# Patient Record
Sex: Female | Born: 1985 | Race: White | Hispanic: No | Marital: Single | Smoking: Never smoker
Health system: Southern US, Community
[De-identification: ages and names within clinical notes are randomized; demographics above are authoritative.]

## PROBLEM LIST (undated history)

## (undated) DIAGNOSIS — N3010 Interstitial cystitis (chronic) without hematuria: Secondary | ICD-10-CM

---
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
## 2003-01-12 ENCOUNTER — Other Ambulatory Visit: Admission: RE | Admit: 2003-01-12 | Discharge: 2003-01-12 | Payer: Self-pay | Admitting: Obstetrics and Gynecology

## 2004-01-31 ENCOUNTER — Other Ambulatory Visit: Admission: RE | Admit: 2004-01-31 | Discharge: 2004-01-31 | Payer: Self-pay | Admitting: Obstetrics and Gynecology

## 2009-06-05 ENCOUNTER — Ambulatory Visit (HOSPITAL_BASED_OUTPATIENT_CLINIC_OR_DEPARTMENT_OTHER): Admission: RE | Admit: 2009-06-05 | Discharge: 2009-06-05 | Payer: Self-pay | Admitting: Urology

## 2010-09-23 ENCOUNTER — Emergency Department (HOSPITAL_COMMUNITY): Admission: EM | Admit: 2010-09-23 | Discharge: 2010-09-23 | Payer: Self-pay | Admitting: Emergency Medicine

## 2010-09-30 ENCOUNTER — Other Ambulatory Visit (HOSPITAL_COMMUNITY)
Admission: RE | Admit: 2010-09-30 | Discharge: 2010-10-16 | Payer: Self-pay | Source: Home / Self Care | Attending: Psychiatry | Admitting: Psychiatry

## 2010-10-10 ENCOUNTER — Ambulatory Visit (HOSPITAL_COMMUNITY): Payer: Self-pay | Admitting: Psychiatry

## 2010-10-11 ENCOUNTER — Ambulatory Visit: Payer: Self-pay | Admitting: Psychiatry

## 2010-11-20 ENCOUNTER — Ambulatory Visit (HOSPITAL_COMMUNITY)
Admission: RE | Admit: 2010-11-20 | Discharge: 2010-11-20 | Payer: Self-pay | Source: Home / Self Care | Attending: Psychiatry | Admitting: Psychiatry

## 2010-12-01 ENCOUNTER — Encounter: Payer: Self-pay | Admitting: Family Medicine

## 2010-12-25 ENCOUNTER — Encounter (HOSPITAL_COMMUNITY): Payer: BC Managed Care – PPO | Admitting: Physician Assistant

## 2010-12-25 DIAGNOSIS — F411 Generalized anxiety disorder: Secondary | ICD-10-CM

## 2011-01-21 LAB — RAPID URINE DRUG SCREEN, HOSP PERFORMED
Barbiturates: NOT DETECTED
Benzodiazepines: POSITIVE — AB
Cocaine: NOT DETECTED

## 2011-01-21 LAB — BASIC METABOLIC PANEL
BUN: 6 mg/dL (ref 6–23)
Calcium: 9.4 mg/dL (ref 8.4–10.5)
Creatinine, Ser: 0.71 mg/dL (ref 0.4–1.2)
GFR calc non Af Amer: >60 mL/min (ref >60)
Glucose, Bld: 89 mg/dL (ref 70–99)

## 2011-01-21 LAB — DIFFERENTIAL
Basophils Absolute: 0 10*3/uL (ref 0.0–0.1)
Basophils Relative: 0 % (ref 0–1)
Eosinophils Relative: 1 % (ref 0–5)
Lymphocytes Relative: 27 % (ref 12–46)
Monocytes Absolute: 0.4 10*3/uL (ref 0.1–1.0)
Neutro Abs: 5.2 10*3/uL (ref 1.7–7.7)

## 2011-01-21 LAB — CBC
HCT: 43.4 % (ref 36.0–46.0)
MCHC: 34 g/dL (ref 30.0–36.0)
Platelets: 241 10*3/uL (ref 150–400)
RDW: 13 % (ref 11.5–15.5)
WBC: 7.9 10*3/uL (ref 4.0–10.5)

## 2011-01-21 LAB — ETHANOL: Alcohol, Ethyl (B): <5 mg/dL (ref 0–10)

## 2011-02-11 ENCOUNTER — Encounter (HOSPITAL_COMMUNITY): Payer: BC Managed Care – PPO | Admitting: Physician Assistant

## 2011-03-12 ENCOUNTER — Encounter (HOSPITAL_COMMUNITY): Payer: BC Managed Care – PPO | Admitting: Physician Assistant

## 2011-03-25 NOTE — Op Note (Signed)
NAME:  BLANKSShellby, Schlink                ACCOUNT NO.:  [id number]   MEDICAL RECORD NO.:  [id number]          PATIENT TYPE:  AMB   LOCATION:  [facility]                         FACILITY:  [facility]   PHYSICIAN:  Martina Sinner, MD DATE OF BIRTH:  04/17/1986   DATE OF PROCEDURE:  06/05/2009  DATE OF DISCHARGE:  06/05/2009                               OPERATIVE REPORT   PREOPERATIVE DIAGNOSIS:  Pelvic pain.   POSTOPERATIVE DIAGNOSIS:  Interstitial cystitis.   SURGERY:  Cystoscopy, hydrodistention, bladder instillation therapy.   Ms. Hanback has symptoms in keeping with interstitial cystitis.  She is  prepped and draped in the usual fashion.  She is given preoperative  ciprofloxacin.  She was hydrodistended 600 mL.  The initial examination  of the bladder was normal.  There were no mucosal abnormalities, stitch,  foreign body or carcinoma.   The bladder was emptied, and on reinspection she had diffuse  glomerulation with impressive bleeding that required gentle irrigation.  The urine was nearly clear.   A red rubber catheter was inserted, and 15 mL of 0.5% Marcaine with 400  mg of Pyridium was instilled.   The patient will be treated as interstitial cystitis.           ______________________________  Martina Sinner, MD  Electronically Signed     SAM/MEDQ  D:  06/14/2009  T:  06/14/2009  Job:  [id number]

## 2011-03-27 ENCOUNTER — Encounter (HOSPITAL_COMMUNITY): Payer: BC Managed Care – PPO | Admitting: Physician Assistant

## 2011-03-27 DIAGNOSIS — F409 Phobic anxiety disorder, unspecified: Secondary | ICD-10-CM

## 2011-06-25 ENCOUNTER — Encounter (HOSPITAL_COMMUNITY): Payer: BC Managed Care – PPO | Admitting: Physician Assistant

## 2011-07-01 ENCOUNTER — Encounter (HOSPITAL_COMMUNITY): Payer: BC Managed Care – PPO | Admitting: Physician Assistant

## 2011-07-01 DIAGNOSIS — F411 Generalized anxiety disorder: Secondary | ICD-10-CM

## 2011-09-23 ENCOUNTER — Encounter (HOSPITAL_COMMUNITY): Payer: BC Managed Care – PPO | Admitting: Physician Assistant

## 2016-06-01 ENCOUNTER — Encounter (HOSPITAL_COMMUNITY): Payer: Self-pay | Admitting: *Deleted

## 2016-06-01 ENCOUNTER — Ambulatory Visit (HOSPITAL_COMMUNITY)
Admission: EM | Admit: 2016-06-01 | Discharge: 2016-06-01 | Disposition: A | Discharge status: Home / Self Care | Payer: 59 | Attending: Family Medicine | Admitting: Family Medicine

## 2016-06-01 DIAGNOSIS — A0811 Acute gastroenteropathy due to Norwalk agent: Secondary | ICD-10-CM

## 2016-06-01 HISTORY — DX: Interstitial cystitis (chronic) without hematuria: N30.10

## 2016-06-01 MED ORDER — ONDANSETRON 4 MG PO TBDP
ORAL_TABLET | ORAL | Status: AC
  Filled 2016-06-01: qty 1

## 2016-06-01 MED ORDER — ONDANSETRON 4 MG PO TBDP: 4.0000 mg | ORAL_TABLET | Freq: Once | ORAL | Status: DC

## 2016-06-01 MED ORDER — ONDANSETRON 4 MG PO TBDP
4.0000 mg | ORAL_TABLET | Freq: Once | ORAL | Status: AC
  Administered 2016-06-01: 4 mg via ORAL

## 2016-06-01 MED ORDER — ONDANSETRON HCL 4 MG PO TABS: 4.0000 mg | ORAL_TABLET | Freq: Four times a day (QID) | ORAL | 0 refills | Status: AC

## 2016-06-01 NOTE — ED Triage Notes (Signed)
Started with intermittent, sharp, intense epigastric and umbilical area pains last night.  Has had multiple episodes of liquid stool, 1 episode vomiting.  Has taken Imodium, Alka Seltzer, and Tums.  Denies any recent abx use.  Pt is a flight attendant.

## 2016-06-01 NOTE — ED Provider Notes (Signed)
[facility]    CSN: [id number] Arrival date & time: 06/01/16  1859  First Provider Contact:  First MD Initiated Contact with Patient 06/01/16 1943        History   Chief Complaint Chief Complaint  Patient presents with  . Abdominal Pain  . Diarrhea    HPI Connie Roberts is a 30 y.o. female.   The history is provided by the patient.  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 Ineffective treatments:  None tried Associated symptoms: diarrhea, nausea and vomiting   Associated symptoms: no chills, no dysuria and no fever   Risk factors: not obese and not pregnant   Diarrhea  Associated symptoms: abdominal pain and vomiting   Associated symptoms: no chills and no fever     Past Medical History:  Diagnosis Date  . Interstitial cystitis     There are no active problems to display for this patient.   History reviewed. No pertinent surgical history.  OB History    No data available       Home Medications    Prior to Admission medications   Medication Sig Start Date End Date Taking? Authorizing Provider  ALPRAZolam (XANAX PO) Take by mouth.   Yes Historical Provider, MD  lisdexamfetamine (VYVANSE) 70 MG capsule Take 70 mg by mouth daily.   Yes Historical Provider, MD    Family History No family history on file.  Social History Social History  Substance Use Topics  . Smoking status: Never Smoker  . Smokeless tobacco: Not on file  . Alcohol use Yes     Comment: occasional     Allergies   Review of patient's allergies indicates no known allergies.   Review of Systems Review of Systems  Constitutional: Positive for  appetite change. Negative for chills and fever.  Gastrointestinal: Positive for abdominal pain, diarrhea, nausea and vomiting. Negative for blood in stool.  Genitourinary: Negative for dysuria, menstrual problem and pelvic pain.  All other systems reviewed and are negative.    Physical Exam Triage Vital Signs ED Triage Vitals  Enc Vitals Group     BP 06/01/16 1929 156/93     Pulse Rate 06/01/16 1929 77     Resp 06/01/16 1929 16     Temp 06/01/16 1929 98 F (36.7 C)     Temp Source 06/01/16 1929 Oral     SpO2 06/01/16 1929 100 %     Weight --      Height --      Head Circumference --      Peak Flow --      Pain Score 06/01/16 1931 7     Pain Loc --      Pain Edu? --      Excl. in GC? --    No data found.   Updated Vital Signs BP 156/93   Pulse 77   Temp 98 F (36.7 C) (Oral)   Resp 16   LMP 05/18/2016 (Approximate)   SpO2 100%   Visual Acuity Right Eye Distance:   Left Eye Distance:   Bilateral Distance:    Right Eye Near:   Left  Eye Near:    Bilateral Near:     Physical Exam  Constitutional: She is oriented to person, place, and time. She appears well-developed and well-nourished. No distress.  HENT:  Mouth/Throat: Oropharynx is clear and moist.  Eyes: Pupils are equal, round, and reactive to light.  Neck: Normal range of motion. Neck supple.  Cardiovascular: Normal rate, normal heart sounds and intact distal pulses.   Pulmonary/Chest: Effort normal and breath sounds normal.  Abdominal: Soft. Bowel sounds are normal. She exhibits no distension and no mass. There is no tenderness. There is no rebound and no guarding. No hernia.  Neurological: She is alert and oriented to person, place, and time.  Skin: Skin is warm and dry.     UC Treatments / Results  Labs (all labs ordered are listed, but only abnormal results are displayed) Labs Reviewed - No data to display  EKG  EKG Interpretation None       Radiology No results  found.  Procedures Procedures (including critical care time)  Medications Ordered in UC Medications  ondansetron (ZOFRAN-ODT) disintegrating tablet 4 mg (4 mg Oral Given 06/01/16 1938)     Initial Impression / Assessment and Plan / UC Course  I have reviewed the triage vital signs and the nursing notes.  Pertinent labs & imaging results that were available during my care of the patient were reviewed by me and considered in my medical decision making (see chart for details).  Clinical Course    Tolerated po challenge   Final Clinical Impressions(s) / UC Diagnoses   Final diagnoses:  None    New Prescriptions New Prescriptions   No medications on file     Linna Hoff, MD 06/01/16 1954

## 2016-06-01 NOTE — Discharge Instructions (Signed)
Clear liquid , bland diet tonight as tolerated, advance on mon as improved, use medicine as needed, return or see your doctor if any problems.  °

## 2016-06-01 NOTE — ED Notes (Signed)
Pt tolerating Gatorade well.

## 2016-11-28 ENCOUNTER — Other Ambulatory Visit: Payer: Self-pay | Admitting: Family Medicine

## 2016-11-28 DIAGNOSIS — R51 Headache: Secondary | ICD-10-CM

## 2016-11-28 DIAGNOSIS — J3489 Other specified disorders of nose and nasal sinuses: Secondary | ICD-10-CM | POA: Diagnosis not present

## 2016-11-28 DIAGNOSIS — R519 Headache, unspecified: Secondary | ICD-10-CM

## 2016-11-28 DIAGNOSIS — J342 Deviated nasal septum: Secondary | ICD-10-CM | POA: Diagnosis not present

## 2016-12-02 ENCOUNTER — Ambulatory Visit
Admission: RE | Admit: 2016-12-02 | Discharge: 2016-12-02 | Disposition: A | Discharge status: Home / Self Care | Payer: 59 | Source: Ambulatory Visit | Attending: Family Medicine | Admitting: Family Medicine

## 2016-12-02 DIAGNOSIS — J3489 Other specified disorders of nose and nasal sinuses: Secondary | ICD-10-CM

## 2016-12-02 DIAGNOSIS — R51 Headache: Secondary | ICD-10-CM

## 2016-12-02 DIAGNOSIS — R519 Headache, unspecified: Secondary | ICD-10-CM

## 2016-12-02 MED ORDER — IOPAMIDOL (ISOVUE-300) INJECTION 61%
75.0000 mL | Freq: Once | INTRAVENOUS | Status: AC | PRN
  Administered 2016-12-02: 75 mL via INTRAVENOUS

## 2017-01-05 DIAGNOSIS — Z1159 Encounter for screening for other viral diseases: Secondary | ICD-10-CM | POA: Diagnosis not present

## 2017-01-05 DIAGNOSIS — J324 Chronic pansinusitis: Secondary | ICD-10-CM | POA: Diagnosis not present

## 2017-01-05 DIAGNOSIS — J342 Deviated nasal septum: Secondary | ICD-10-CM | POA: Diagnosis not present

## 2017-01-05 DIAGNOSIS — Z01419 Encounter for gynecological examination (general) (routine) without abnormal findings: Secondary | ICD-10-CM | POA: Diagnosis not present

## 2017-01-05 DIAGNOSIS — J343 Hypertrophy of nasal turbinates: Secondary | ICD-10-CM | POA: Diagnosis not present

## 2017-02-16 DIAGNOSIS — J310 Chronic rhinitis: Secondary | ICD-10-CM | POA: Diagnosis not present

## 2017-02-16 DIAGNOSIS — J324 Chronic pansinusitis: Secondary | ICD-10-CM | POA: Diagnosis not present

## 2017-02-16 DIAGNOSIS — R609 Edema, unspecified: Secondary | ICD-10-CM | POA: Diagnosis not present

## 2017-02-16 DIAGNOSIS — J342 Deviated nasal septum: Secondary | ICD-10-CM | POA: Diagnosis not present

## 2017-02-16 DIAGNOSIS — J343 Hypertrophy of nasal turbinates: Secondary | ICD-10-CM | POA: Diagnosis not present

## 2017-04-22 DIAGNOSIS — R3989 Other symptoms and signs involving the genitourinary system: Secondary | ICD-10-CM | POA: Diagnosis not present

## 2017-05-06 ENCOUNTER — Other Ambulatory Visit: Payer: Self-pay | Admitting: Orthopedic Surgery

## 2017-05-06 DIAGNOSIS — M25511 Pain in right shoulder: Secondary | ICD-10-CM

## 2017-05-19 ENCOUNTER — Ambulatory Visit
Admission: RE | Admit: 2017-05-19 | Discharge: 2017-05-19 | Disposition: A | Discharge status: Home / Self Care | Payer: Worker's Compensation | Source: Ambulatory Visit | Attending: Orthopedic Surgery | Admitting: Orthopedic Surgery

## 2017-05-19 DIAGNOSIS — M25511 Pain in right shoulder: Secondary | ICD-10-CM

## 2017-10-08 DIAGNOSIS — Z23 Encounter for immunization: Secondary | ICD-10-CM | POA: Diagnosis not present

## 2017-10-08 DIAGNOSIS — Z0000 Encounter for general adult medical examination without abnormal findings: Secondary | ICD-10-CM | POA: Diagnosis not present

## 2017-10-08 DIAGNOSIS — Z13220 Encounter for screening for lipoid disorders: Secondary | ICD-10-CM | POA: Diagnosis not present

## 2017-10-23 DIAGNOSIS — J342 Deviated nasal septum: Secondary | ICD-10-CM | POA: Diagnosis not present

## 2017-10-23 DIAGNOSIS — J329 Chronic sinusitis, unspecified: Secondary | ICD-10-CM | POA: Diagnosis not present

## 2017-11-12 DIAGNOSIS — J040 Acute laryngitis: Secondary | ICD-10-CM | POA: Diagnosis not present

## 2017-12-10 DIAGNOSIS — Z118 Encounter for screening for other infectious and parasitic diseases: Secondary | ICD-10-CM | POA: Diagnosis not present

## 2017-12-10 DIAGNOSIS — Z1159 Encounter for screening for other viral diseases: Secondary | ICD-10-CM | POA: Diagnosis not present

## 2017-12-10 DIAGNOSIS — N760 Acute vaginitis: Secondary | ICD-10-CM | POA: Diagnosis not present

## 2018-02-08 DIAGNOSIS — Z01419 Encounter for gynecological examination (general) (routine) without abnormal findings: Secondary | ICD-10-CM | POA: Diagnosis not present

## 2018-02-08 DIAGNOSIS — Z118 Encounter for screening for other infectious and parasitic diseases: Secondary | ICD-10-CM | POA: Diagnosis not present

## 2018-02-26 DIAGNOSIS — J329 Chronic sinusitis, unspecified: Secondary | ICD-10-CM | POA: Diagnosis not present

## 2018-02-26 DIAGNOSIS — J342 Deviated nasal septum: Secondary | ICD-10-CM | POA: Diagnosis not present

## 2018-05-15 DIAGNOSIS — J020 Streptococcal pharyngitis: Secondary | ICD-10-CM | POA: Diagnosis not present

## 2018-05-15 DIAGNOSIS — H9209 Otalgia, unspecified ear: Secondary | ICD-10-CM | POA: Diagnosis not present

## 2018-06-01 DIAGNOSIS — Z01818 Encounter for other preprocedural examination: Secondary | ICD-10-CM | POA: Diagnosis not present

## 2018-06-01 DIAGNOSIS — Z005 Encounter for examination of potential donor of organ and tissue: Secondary | ICD-10-CM | POA: Diagnosis not present

## 2018-06-24 DIAGNOSIS — J342 Deviated nasal septum: Secondary | ICD-10-CM | POA: Diagnosis not present

## 2018-06-24 DIAGNOSIS — J320 Chronic maxillary sinusitis: Secondary | ICD-10-CM | POA: Diagnosis not present

## 2018-06-24 DIAGNOSIS — J343 Hypertrophy of nasal turbinates: Secondary | ICD-10-CM | POA: Diagnosis not present

## 2018-06-24 DIAGNOSIS — J322 Chronic ethmoidal sinusitis: Secondary | ICD-10-CM | POA: Diagnosis not present

## 2018-06-24 HISTORY — PX: NASAL SEPTOPLASTY W/ TURBINOPLASTY: SHX2070

## 2018-07-08 DIAGNOSIS — Z005 Encounter for examination of potential donor of organ and tissue: Secondary | ICD-10-CM | POA: Diagnosis not present

## 2018-07-09 DIAGNOSIS — Z005 Encounter for examination of potential donor of organ and tissue: Secondary | ICD-10-CM | POA: Diagnosis not present

## 2018-07-09 DIAGNOSIS — N3090 Cystitis, unspecified without hematuria: Secondary | ICD-10-CM | POA: Diagnosis not present

## 2018-07-09 DIAGNOSIS — Z524 Kidney donor: Secondary | ICD-10-CM | POA: Diagnosis not present

## 2018-07-09 DIAGNOSIS — I7789 Other specified disorders of arteries and arterioles: Secondary | ICD-10-CM | POA: Diagnosis not present

## 2018-08-07 DIAGNOSIS — Z005 Encounter for examination of potential donor of organ and tissue: Secondary | ICD-10-CM | POA: Diagnosis not present

## 2018-08-19 ENCOUNTER — Other Ambulatory Visit (HOSPITAL_COMMUNITY): Payer: Self-pay

## 2018-08-19 DIAGNOSIS — Z01818 Encounter for other preprocedural examination: Secondary | ICD-10-CM

## 2018-08-30 ENCOUNTER — Ambulatory Visit (HOSPITAL_COMMUNITY)
Admission: RE | Admit: 2018-08-30 | Discharge: 2018-08-30 | Disposition: A | Discharge status: Home / Self Care | Payer: 59 | Source: Ambulatory Visit | Attending: Family Medicine | Admitting: Family Medicine

## 2018-08-30 DIAGNOSIS — R9431 Abnormal electrocardiogram [ECG] [EKG]: Secondary | ICD-10-CM | POA: Insufficient documentation

## 2018-08-30 DIAGNOSIS — Z01818 Encounter for other preprocedural examination: Secondary | ICD-10-CM | POA: Insufficient documentation

## 2018-08-30 NOTE — Progress Notes (Signed)
  Echocardiogram 2D Echocardiogram has been performed.  Janalyn Harder 08/30/2018, 11:55 AM

## 2018-09-07 DIAGNOSIS — Z23 Encounter for immunization: Secondary | ICD-10-CM | POA: Diagnosis not present

## 2018-09-27 DIAGNOSIS — Z0000 Encounter for general adult medical examination without abnormal findings: Secondary | ICD-10-CM | POA: Diagnosis not present

## 2018-09-27 DIAGNOSIS — Z13220 Encounter for screening for lipoid disorders: Secondary | ICD-10-CM | POA: Diagnosis not present

## 2019-09-05 ENCOUNTER — Encounter (INDEPENDENT_AMBULATORY_CARE_PROVIDER_SITE_OTHER): Payer: Self-pay

## 2020-05-31 ENCOUNTER — Other Ambulatory Visit: Payer: Self-pay | Admitting: Obstetrics and Gynecology

## 2020-05-31 DIAGNOSIS — N644 Mastodynia: Secondary | ICD-10-CM

## 2020-07-23 ENCOUNTER — Ambulatory Visit
Admission: RE | Admit: 2020-07-23 | Discharge: 2020-07-23 | Disposition: A | Discharge status: Home / Self Care | Payer: PRIVATE HEALTH INSURANCE | Source: Ambulatory Visit | Attending: Obstetrics and Gynecology | Admitting: Obstetrics and Gynecology

## 2020-07-23 ENCOUNTER — Other Ambulatory Visit: Payer: Self-pay

## 2020-07-23 DIAGNOSIS — N644 Mastodynia: Secondary | ICD-10-CM

## 2021-11-07 IMAGING — MG DIGITAL DIAGNOSTIC BILAT W/ TOMO W/ CAD
6 of 10 series · 6 of 30 positions shown · non-contrast
Comparison: None.

CLINICAL DATA: 34-year-old female presenting for evaluation of
focal left breast pain since [REDACTED] of this year. She has family
history of breast cancer diagnosed in her cousin in her 40s.

EXAM:
DIGITAL DIAGNOSTIC BILATERAL MAMMOGRAM WITH TOMO AND CAD; ULTRASOUND
LEFT BREAST LIMITED

[L MLO synth-2D]
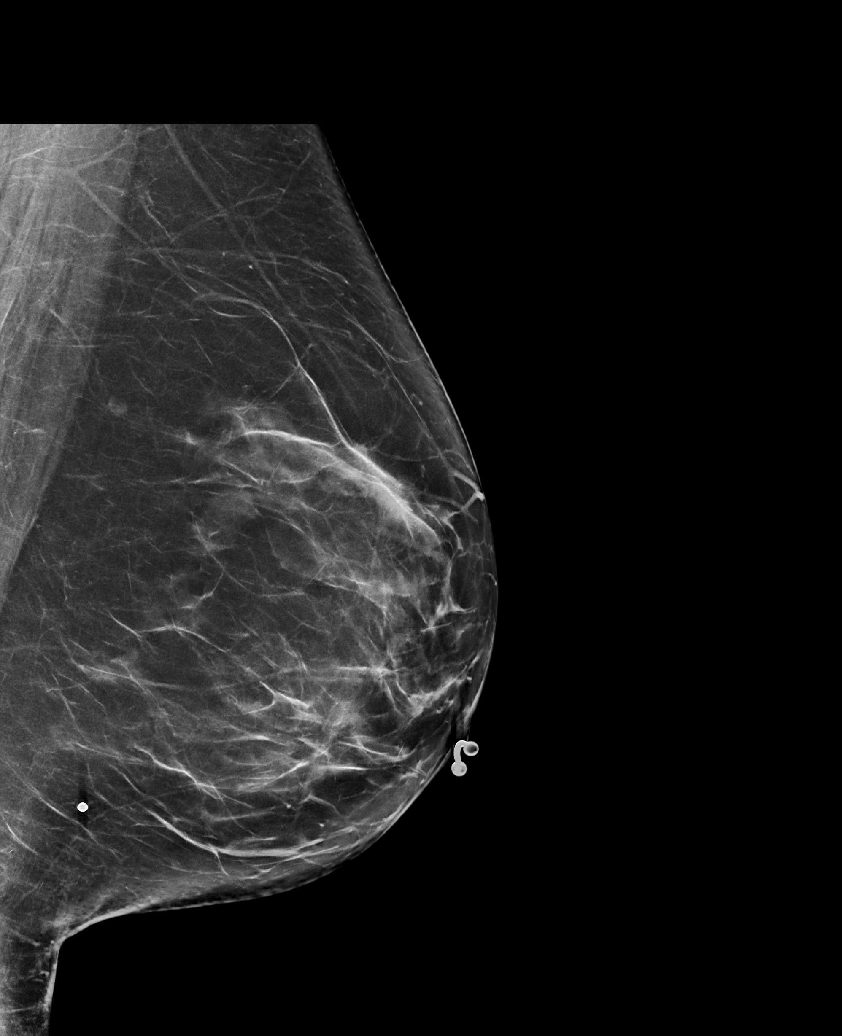

[R MLO synth-2D]
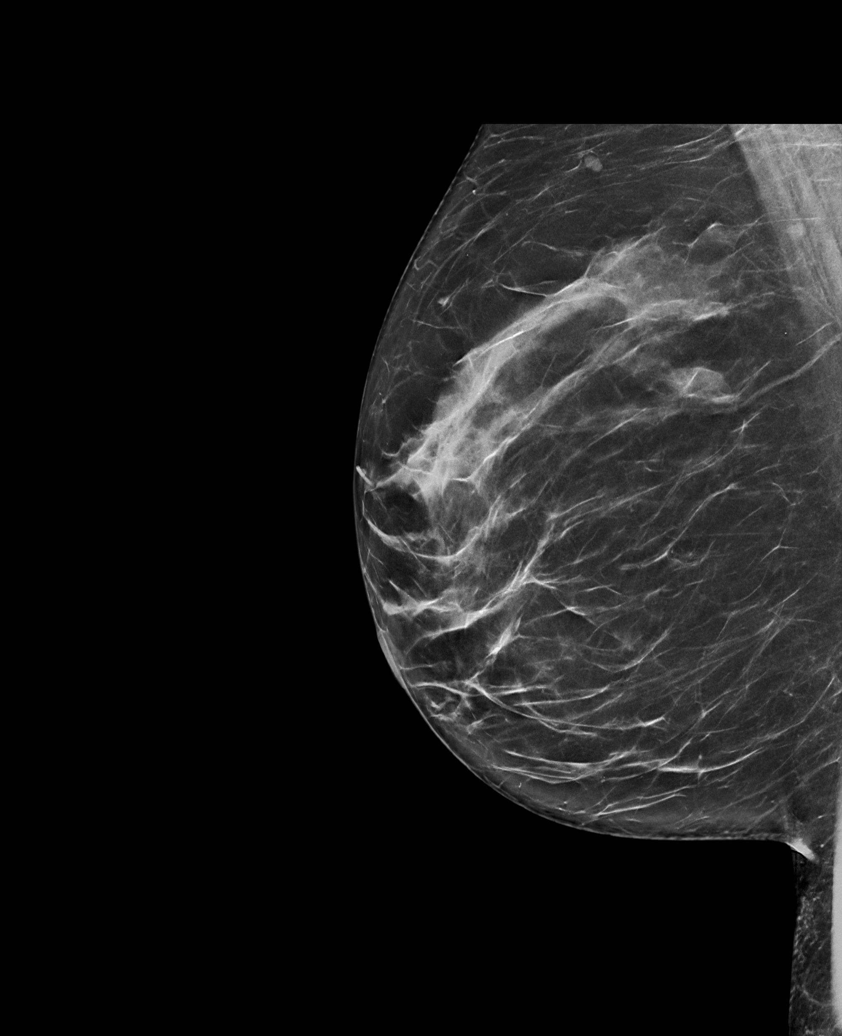

[R CC synth-2D]
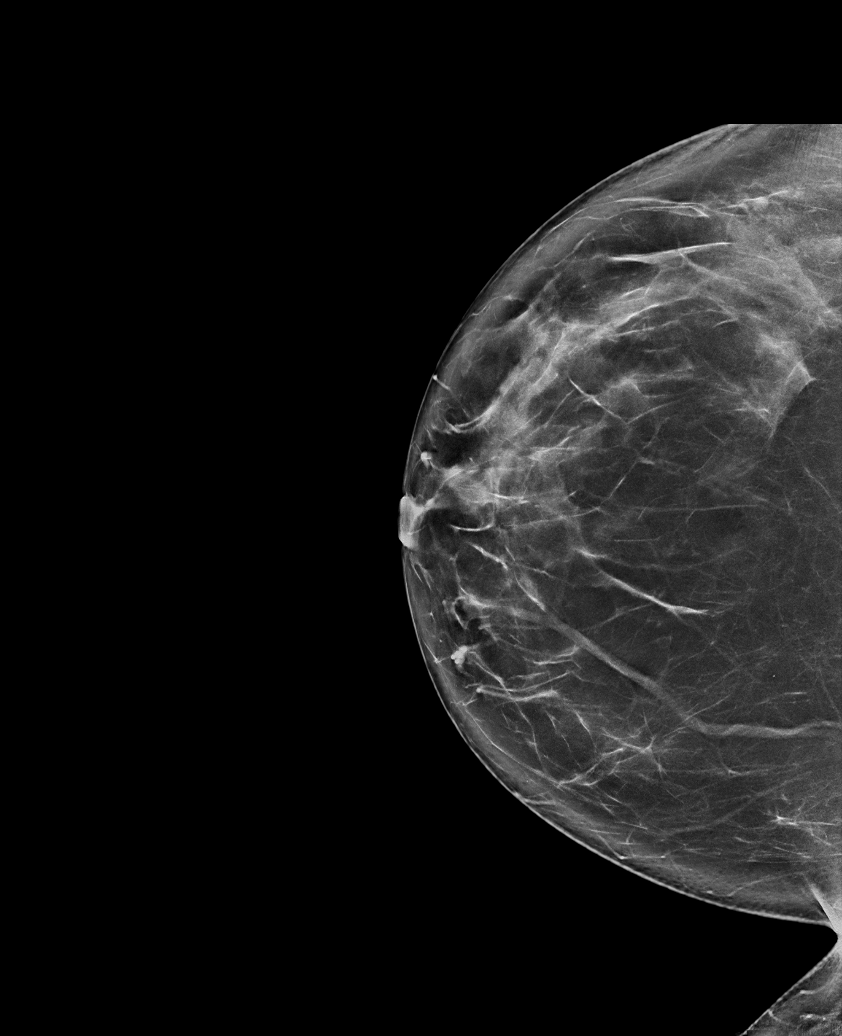

[L CC synth-2D (1 of 2)]
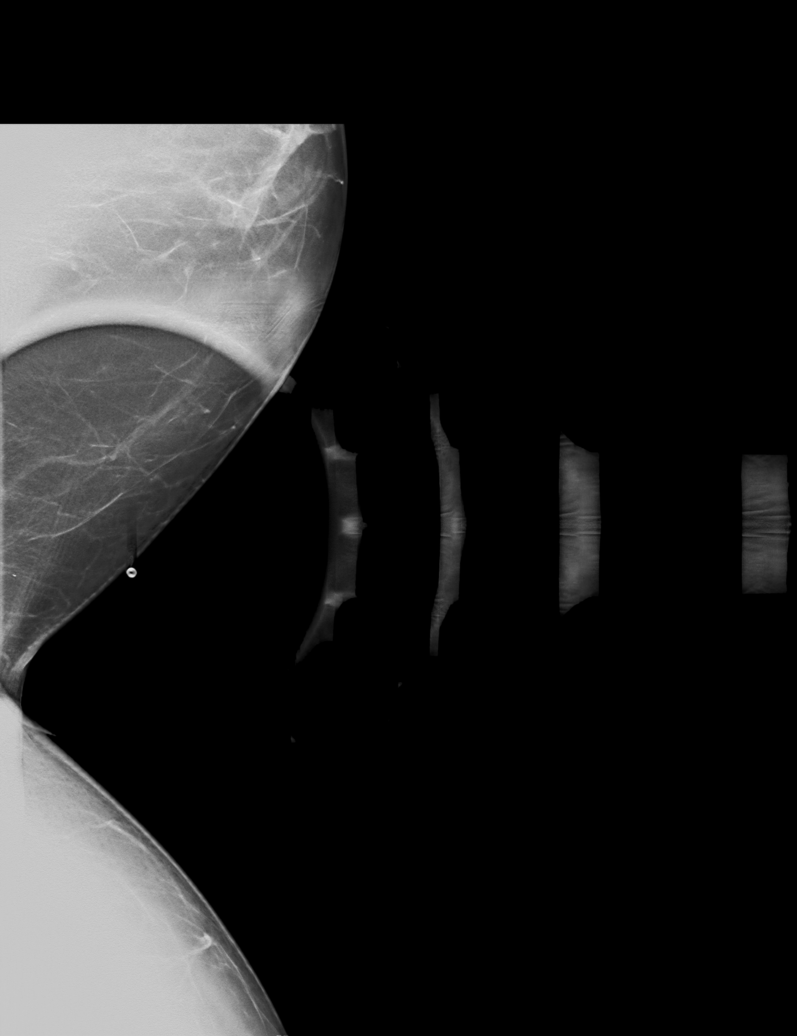

[L CC synth-2D (2 of 2)]
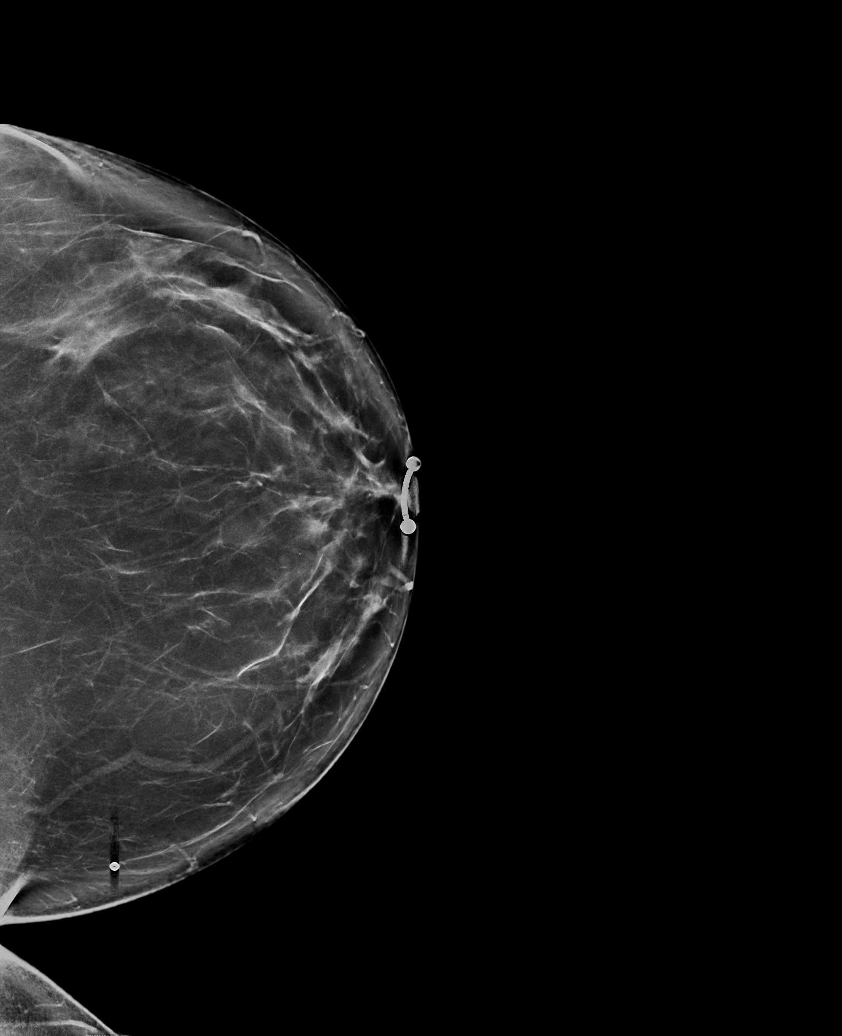

[R MLO tomo · tomo slice 43/85.0]
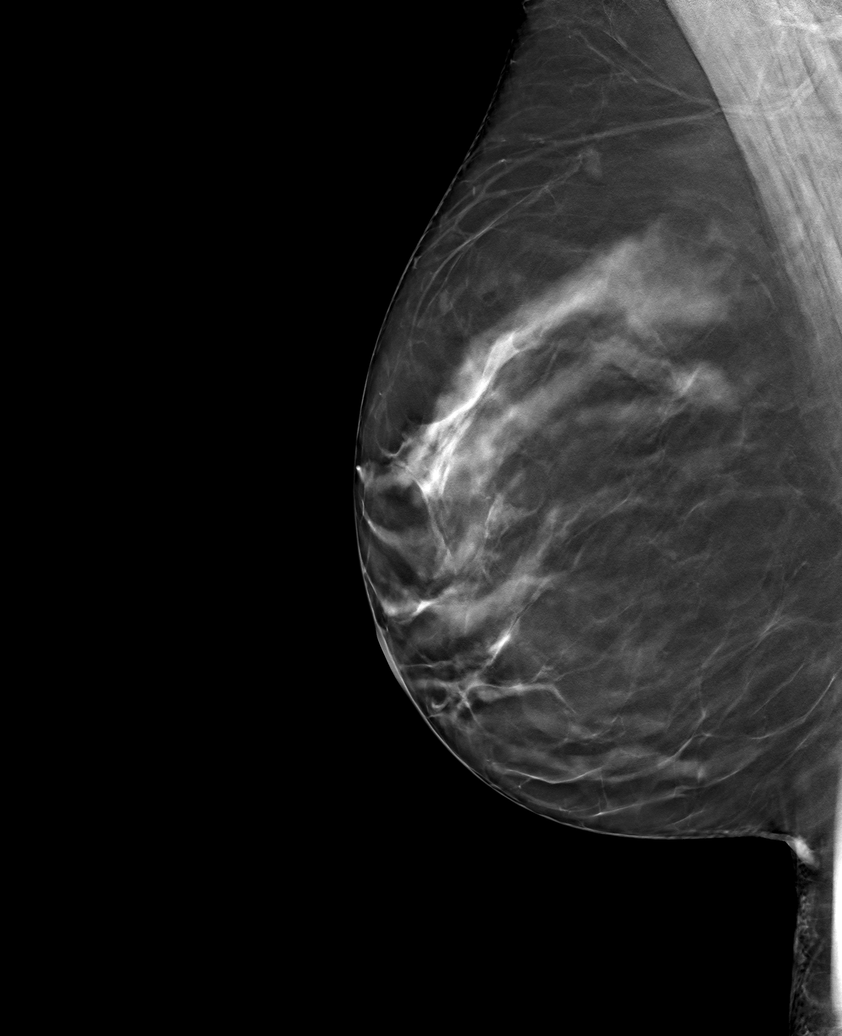

[6 of 30 positions shown; findings below may reference images not displayed]

ACR Breast Density Category b: There are scattered areas of
fibroglandular density.
FINDINGS: A BB has been placed on the medial aspect of the left breast
indicating the site of focal pain. No suspicious mammographic
findings are seen deep to the marker. No suspicious calcifications,
masses or areas of distortion are seen in the bilateral breasts.

Mammographic images were processed with CAD.

Ultrasound targeted to the site of focal pain in the medial to lower
inner left breast demonstrates normal fibroglandular tissue. No
suspicious masses or areas of shadowing are identified.
IMPRESSION: 1. There are no suspicious mammographic or targeted sonographic
abnormalities at the site of focal pain in the medial left breast.

2.  No mammographic evidence of malignancy in the bilateral breasts.

RECOMMENDATION:
1. Clinical follow-up recommended for focally tender area of concern
in the medial left breast. Any further workup should be based on
clinical grounds.

2. Screening mammogram at age 40 unless there are persistent or
intervening clinical concerns. (Code:[id number])

I have discussed the findings and recommendations with the patient.
If applicable, a reminder letter will be sent to the patient
regarding the next appointment.

BI-RADS CATEGORY  1: Negative.

## 2024-06-09 ENCOUNTER — Other Ambulatory Visit (HOSPITAL_COMMUNITY): Payer: Self-pay | Admitting: Sports Medicine

## 2024-06-09 DIAGNOSIS — G5761 Lesion of plantar nerve, right lower limb: Secondary | ICD-10-CM
